# Patient Record
Sex: Female | Born: 1950 | Race: Black or African American | Hispanic: No | Smoking: Current every day smoker
Health system: Southern US, Community
[De-identification: ages and names within clinical notes are randomized; demographics above are authoritative.]

## PROBLEM LIST (undated history)

## (undated) DIAGNOSIS — G8929 Other chronic pain: Secondary | ICD-10-CM

## (undated) DIAGNOSIS — I10 Essential (primary) hypertension: Secondary | ICD-10-CM

## (undated) HISTORY — DX: Other chronic pain: G89.29

## (undated) HISTORY — PX: ABDOMINAL HYSTERECTOMY: SHX81

## (undated) HISTORY — PX: OTHER SURGICAL HISTORY: SHX169

## (undated) HISTORY — PX: CHOLECYSTECTOMY: SHX55

---

## 2007-12-02 ENCOUNTER — Emergency Department (HOSPITAL_COMMUNITY): Admission: EM | Admit: 2007-12-02 | Discharge: 2007-12-02 | Payer: Self-pay | Admitting: Emergency Medicine

## 2008-06-19 ENCOUNTER — Emergency Department (HOSPITAL_COMMUNITY): Admission: EM | Admit: 2008-06-19 | Discharge: 2008-06-19 | Payer: Self-pay | Admitting: Family Medicine

## 2008-10-22 ENCOUNTER — Encounter: Admission: RE | Admit: 2008-10-22 | Discharge: 2008-10-22 | Payer: Self-pay | Admitting: Family Medicine

## 2009-10-31 ENCOUNTER — Ambulatory Visit (HOSPITAL_COMMUNITY): Admission: RE | Admit: 2009-10-31 | Discharge: 2009-10-31 | Payer: Self-pay | Admitting: General Surgery

## 2009-11-18 ENCOUNTER — Ambulatory Visit (HOSPITAL_COMMUNITY): Admission: RE | Admit: 2009-11-18 | Discharge: 2009-11-18 | Payer: Self-pay | Admitting: General Surgery

## 2010-04-05 ENCOUNTER — Encounter: Payer: Self-pay | Admitting: General Surgery

## 2010-05-29 LAB — BASIC METABOLIC PANEL
BUN: 10 mg/dL (ref 6–23)
CO2: 27 mEq/L (ref 19–32)
Chloride: 106 mEq/L (ref 96–112)
Creatinine, Ser: 0.81 mg/dL (ref 0.4–1.2)
GFR calc non Af Amer: >60 mL/min (ref >60)
Sodium: 141 mEq/L (ref 135–145)

## 2010-05-29 LAB — SURGICAL PCR SCREEN: MRSA, PCR: NEGATIVE

## 2014-03-21 ENCOUNTER — Emergency Department (HOSPITAL_COMMUNITY)
Admission: EM | Admit: 2014-03-21 | Discharge: 2014-03-21 | Disposition: A | Discharge status: Home / Self Care | Payer: Medicare HMO | Attending: Emergency Medicine | Admitting: Emergency Medicine

## 2014-03-21 ENCOUNTER — Encounter (HOSPITAL_COMMUNITY): Payer: Self-pay | Admitting: Emergency Medicine

## 2014-03-21 ENCOUNTER — Emergency Department (HOSPITAL_COMMUNITY): Payer: Medicare HMO

## 2014-03-21 DIAGNOSIS — R079 Chest pain, unspecified: Secondary | ICD-10-CM | POA: Insufficient documentation

## 2014-03-21 DIAGNOSIS — R61 Generalized hyperhidrosis: Secondary | ICD-10-CM | POA: Diagnosis not present

## 2014-03-21 DIAGNOSIS — R0602 Shortness of breath: Secondary | ICD-10-CM | POA: Insufficient documentation

## 2014-03-21 DIAGNOSIS — M25551 Pain in right hip: Secondary | ICD-10-CM | POA: Diagnosis not present

## 2014-03-21 DIAGNOSIS — Z720 Tobacco use: Secondary | ICD-10-CM | POA: Diagnosis not present

## 2014-03-21 DIAGNOSIS — M549 Dorsalgia, unspecified: Secondary | ICD-10-CM | POA: Diagnosis not present

## 2014-03-21 HISTORY — DX: Essential (primary) hypertension: I10

## 2014-03-21 LAB — CBC WITH DIFFERENTIAL/PLATELET
BASOS PCT: 1 % (ref 0–1)
Basophils Absolute: 0 10*3/uL (ref 0.0–0.1)
EOS PCT: 4 % (ref 0–5)
Eosinophils Absolute: 0.2 10*3/uL (ref 0.0–0.7)
HEMATOCRIT: 35.4 % — AB (ref 36.0–46.0)
HEMOGLOBIN: 11.2 g/dL — AB (ref 12.0–15.0)
LYMPHS ABS: 2.4 10*3/uL (ref 0.7–4.0)
LYMPHS PCT: 41 % (ref 12–46)
MCH: 28.6 pg (ref 26.0–34.0)
MCHC: 31.6 g/dL (ref 30.0–36.0)
MCV: 90.3 fL (ref 78.0–100.0)
MONOS PCT: 7 % (ref 3–12)
Monocytes Absolute: 0.4 10*3/uL (ref 0.1–1.0)
Neutro Abs: 2.7 10*3/uL (ref 1.7–7.7)
Neutrophils Relative %: 47 % (ref 43–77)
Platelets: 257 10*3/uL (ref 150–400)
RBC: 3.92 MIL/uL (ref 3.87–5.11)
RDW: 14.2 % (ref 11.5–15.5)
WBC: 5.7 10*3/uL (ref 4.0–10.5)

## 2014-03-21 LAB — BASIC METABOLIC PANEL
Anion gap: 7 (ref 5–15)
BUN: 15 mg/dL (ref 6–23)
CALCIUM: 8.8 mg/dL (ref 8.4–10.5)
CO2: 25 mmol/L (ref 19–32)
Chloride: 107 mEq/L (ref 96–112)
Creatinine, Ser: 0.91 mg/dL (ref 0.50–1.10)
GFR calc non Af Amer: 67 mL/min — ABNORMAL LOW (ref >90)
GFR, EST AFRICAN AMERICAN: 77 mL/min — AB (ref >90)
GLUCOSE: 110 mg/dL — AB (ref 70–99)
POTASSIUM: 3.7 mmol/L (ref 3.5–5.1)
SODIUM: 139 mmol/L (ref 135–145)

## 2014-03-21 LAB — D-DIMER, QUANTITATIVE (NOT AT ARMC)

## 2014-03-21 LAB — TROPONIN I

## 2014-03-21 MED ORDER — NAPROXEN 500 MG PO TABS: 500.0000 mg | ORAL_TABLET | Freq: Two times a day (BID) | ORAL | Status: AC

## 2014-03-21 MED ORDER — KETOROLAC TROMETHAMINE 30 MG/ML IJ SOLN
INTRAMUSCULAR | Status: AC
Start: 2014-03-21 — End: 2014-03-21
  Filled 2014-03-21: qty 1

## 2014-03-21 MED ORDER — KETOROLAC TROMETHAMINE 30 MG/ML IJ SOLN
30.0000 mg | Freq: Once | INTRAMUSCULAR | Status: AC
  Administered 2014-03-21: 30 mg via INTRAVENOUS

## 2014-03-21 NOTE — ED Notes (Signed)
Dr. glick at the bedside. 

## 2014-03-21 NOTE — Discharge Instructions (Signed)
Go for the tests which were ordered by your cardiologist.   Chest Pain (Nonspecific) It is often hard to give a specific diagnosis for the cause of chest pain. There is always a chance that your pain could be related to something serious, such as a heart attack or a blood clot in the lungs. You need to follow up with your health care provider for further evaluation. CAUSES   Heartburn.  Pneumonia or bronchitis.  Anxiety or stress.  Inflammation around your heart (pericarditis) or lung (pleuritis or pleurisy).  A blood clot in the lung.  A collapsed lung (pneumothorax). It can develop suddenly on its own (spontaneous pneumothorax) or from trauma to the chest.  Shingles infection (herpes zoster virus). The chest wall is composed of bones, muscles, and cartilage. Any of these can be the source of the pain.  The bones can be bruised by injury.  The muscles or cartilage can be strained by coughing or overwork.  The cartilage can be affected by inflammation and become sore (costochondritis). DIAGNOSIS  Lab tests or other studies may be needed to find the cause of your pain. Your health care provider may have you take a test called an ambulatory electrocardiogram (ECG). An ECG records your heartbeat patterns over a 24-hour period. You may also have other tests, such as:  Transthoracic echocardiogram (TTE). During echocardiography, sound waves are used to evaluate how blood flows through your heart.  Transesophageal echocardiogram (TEE).  Cardiac monitoring. This allows your health care provider to monitor your heart rate and rhythm in real time.  Holter monitor. This is a portable device that records your heartbeat and can help diagnose heart arrhythmias. It allows your health care provider to track your heart activity for several days, if needed.  Stress tests by exercise or by giving medicine that makes the heart beat faster. TREATMENT   Treatment depends on what may be causing  your chest pain. Treatment may include:  Acid blockers for heartburn.  Anti-inflammatory medicine.  Pain medicine for inflammatory conditions.  Antibiotics if an infection is present.  You may be advised to change lifestyle habits. This includes stopping smoking and avoiding alcohol, caffeine, and chocolate.  You may be advised to keep your head raised (elevated) when sleeping. This reduces the chance of acid going backward from your stomach into your esophagus. Most of the time, nonspecific chest pain will improve within 2-3 days with rest and mild pain medicine.  HOME CARE INSTRUCTIONS   If antibiotics were prescribed, take them as directed. Finish them even if you start to feel better.  For the next few days, avoid physical activities that bring on chest pain. Continue physical activities as directed.  Do not use any tobacco products, including cigarettes, chewing tobacco, or electronic cigarettes.  Avoid drinking alcohol.  Only take medicine as directed by your health care provider.  Follow your health care provider's suggestions for further testing if your chest pain does not go away.  Keep any follow-up appointments you made. If you do not go to an appointment, you could develop lasting (chronic) problems with pain. If there is any problem keeping an appointment, call to reschedule. SEEK MEDICAL CARE IF:   Your chest pain does not go away, even after treatment.  You have a rash with blisters on your chest.  You have a fever. SEEK IMMEDIATE MEDICAL CARE IF:   You have increased chest pain or pain that spreads to your arm, neck, jaw, back, or abdomen.  You have  shortness of breath.  You have an increasing cough, or you cough up blood.  You have severe back or abdominal pain.  You feel nauseous or vomit.  You have severe weakness.  You faint.  You have chills. This is an emergency. Do not wait to see if the pain will go away. Get medical help at once. Call your  local emergency services (911 in [place] Do not drive yourself to the hospital. MAKE SURE YOU:   Understand these instructions.  Will watch your condition.  Will get help right away if you are not doing well or get worse. Document Released: 12/09/2004 Document Revised: 03/06/2013 Document Reviewed: 10/05/2007 [facility] Patient Information 2015 [place], [place]. This information is not intended to replace advice given to you by your health care provider. Make sure you discuss any questions you have with your health care provider.  Naproxen and naproxen sodium oral immediate-release tablets What is this medicine? NAPROXEN (na PROX en) is a non-steroidal anti-inflammatory drug (NSAID). It is used to reduce swelling and to treat pain. This medicine may be used for dental pain, headache, or painful monthly periods. It is also used for painful joint and muscular problems such as arthritis, tendinitis, bursitis, and gout. This medicine may be used for other purposes; ask your health care provider or pharmacist if you have questions. COMMON BRAND NAME(S): Aflaxen, Aleve, Aleve Arthritis, All Day Relief, Anaprox, Anaprox DS, Naprosyn What should I tell my health care provider before I take this medicine? They need to know if you have any of these conditions: -asthma -cigarette smoker -drink more than 3 alcohol containing drinks a day -heart disease or circulation problems such as heart failure or leg edema (fluid retention) -high blood pressure -kidney disease -liver disease -stomach bleeding or ulcers -an unusual or allergic reaction to naproxen, aspirin, other NSAIDs, other medicines, foods, dyes, or preservatives -pregnant or trying to get pregnant -breast-feeding How should I use this medicine? Take this medicine by mouth with a glass of water. Follow the directions on the prescription label. Take it with food if your stomach gets upset. Try to not lie down for at least 10 minutes after you  take it. Take your medicine at regular intervals. Do not take your medicine more often than directed. Long-term, continuous use may increase the risk of heart attack or stroke. A special MedGuide will be given to you by the pharmacist with each prescription and refill. Be sure to read this information carefully each time. Talk to your pediatrician regarding the use of this medicine in children. Special care may be needed. Overdosage: If you think you have taken too much of this medicine contact a poison control center or emergency room at once. NOTE: This medicine is only for you. Do not share this medicine with others. What if I miss a dose? If you miss a dose, take it as soon as you can. If it is almost time for your next dose, take only that dose. Do not take double or extra doses. What may interact with this medicine? -alcohol -aspirin -cidofovir -diuretics -lithium -methotrexate -other drugs for inflammation like ketorolac or prednisone -pemetrexed -probenecid -warfarin This list may not describe all possible interactions. Give your health care provider a list of all the medicines, herbs, non-prescription drugs, or dietary supplements you use. Also tell them if you smoke, drink alcohol, or use illegal drugs. Some items may interact with your medicine. What should I watch for while using this medicine? Tell your doctor or health  care professional if your pain does not get better. Talk to your doctor before taking another medicine for pain. Do not treat yourself. This medicine does not prevent heart attack or stroke. In fact, this medicine may increase the chance of a heart attack or stroke. The chance may increase with longer use of this medicine and in people who have heart disease. If you take aspirin to prevent heart attack or stroke, talk with your doctor or health care professional. Do not take other medicines that contain aspirin, ibuprofen, or naproxen with this medicine. Side  effects such as stomach upset, nausea, or ulcers may be more likely to occur. Many medicines available without a prescription should not be taken with this medicine. This medicine can cause ulcers and bleeding in the stomach and intestines at any time during treatment. Do not smoke cigarettes or drink alcohol. These increase irritation to your stomach and can make it more susceptible to damage from this medicine. Ulcers and bleeding can happen without warning symptoms and can cause death. You may get drowsy or dizzy. Do not drive, use machinery, or do anything that needs mental alertness until you know how this medicine affects you. Do not stand or sit up quickly, especially if you are an older patient. This reduces the risk of dizzy or fainting spells. This medicine can cause you to bleed more easily. Try to avoid damage to your teeth and gums when you brush or floss your teeth. What side effects may I notice from receiving this medicine? Side effects that you should report to your doctor or health care professional as soon as possible: -black or bloody stools, blood in the urine or vomit -blurred vision -chest pain -difficulty breathing or wheezing -nausea or vomiting -severe stomach pain -skin rash, skin redness, blistering or peeling skin, hives, or itching -slurred speech or weakness on one side of the body -swelling of eyelids, throat, lips -unexplained weight gain or swelling -unusually weak or tired -yellowing of eyes or skin Side effects that usually do not require medical attention (report to your doctor or health care professional if they continue or are bothersome): -constipation -headache -heartburn This list may not describe all possible side effects. Call your doctor for medical advice about side effects. You may report side effects to FDA at 1-800-FDA-1088. Where should I keep my medicine? Keep out of the reach of children. Store at room temperature between 15 and 30 degrees  C (59 and 86 degrees F). Keep container tightly closed. Throw away any unused medicine after the expiration date. NOTE: This sheet is a summary. It may not cover all possible information. If you have questions about this medicine, talk to your doctor, pharmacist, or health care provider.  2015, [employer]. (2009-03-03 20:10:16)

## 2014-03-21 NOTE — ED Notes (Signed)
Called main lab in regards to d-dimer.

## 2014-03-21 NOTE — ED Provider Notes (Signed)
CSN: [id number]     Arrival date & time 03/21/14  0133 History  This chart was scribe for Dione Booze, MD by Angelene Giovanni, ED Scribe. The patient was seen in room A01C/A01C and the patient's care was started at 1:49 AM.     Chief Complaint  Patient presents with  . Chest Pain   The history is provided by the patient. No language interpreter was used.   HPI Comments: Makayla Bishop is a 64 y.o. female with a hx of HTN who presents to the Emergency Department complaining of a gradually worsening right hip pain that radiated down to her foot onset a couple of weeks and a sharp intermittent lower sternal area pain that radiates to her back onset 1 week ago. She explains that the pain lasts about 10 minutes and she cannot move or breathe during onset. She also reports associated SOB and diaphoresis. She denies N/V and cough. She states that she took 2 baby aspirin PTA. She reports that she was smoking 3 or 4 cigarettes a day before onset of symptoms. She states a family hx of diabetes and that her father died of CHF at the age of 39. She denies any pain currently.   PCP: Dr. Carmina Miller Cardiologist: [facility]   No past medical history on file. No past surgical history on file. No family history on file. History  Substance Use Topics  . Smoking status: Not on file  . Smokeless tobacco: Not on file  . Alcohol Use: Not on file   OB History    No data available     Review of Systems  Constitutional: Positive for diaphoresis.  Respiratory: Positive for shortness of breath. Negative for cough.   Cardiovascular: Positive for chest pain.  Gastrointestinal: Negative for nausea and vomiting.  Musculoskeletal: Positive for back pain.  All other systems reviewed and are negative.     Allergies  Review of patient's allergies indicates not on file.  Home Medications   Prior to Admission medications   Not on File   BP 129/79 mmHg  Pulse 79  Temp(Src) 98 F (36.7 C) (Oral)   Resp 18  Ht  (1.803 m)  Wt 289 lb (131.09 kg)  BMI 40.33 kg/m2  SpO2 99% Physical Exam  Constitutional: She is oriented to person, place, and time. She appears well-developed and well-nourished. No distress.  HENT:  Head: Normocephalic and atraumatic.  Eyes: Conjunctivae and EOM are normal. Pupils are equal, round, and reactive to light.  Neck: Normal range of motion. Neck supple. No JVD present.  Cardiovascular: Normal rate, regular rhythm and normal heart sounds.   No murmur heard. Pulmonary/Chest: Effort normal and breath sounds normal. She has no wheezes. She has no rales.  Abdominal: Soft. Bowel sounds are normal. She exhibits no distension and no mass. There is no tenderness.  Musculoskeletal: Normal range of motion. She exhibits no edema.  Dorsalis pedis pulses are weak bilaterally but strong posterior tibial pulses   Lymphadenopathy:    She has no cervical adenopathy.  Neurological: She is alert and oriented to person, place, and time. She has normal reflexes. No cranial nerve deficit. Coordination normal.  Skin: Skin is warm and dry. No rash noted.  Psychiatric: She has a normal mood and affect. Her behavior is normal. Judgment and thought content normal.  Nursing note and vitals reviewed.   ED Course  Procedures (including critical care time) DIAGNOSTIC STUDIES: Oxygen Saturation is 99% on RA, normal by my interpretation.  COORDINATION OF CARE: 2:00 AM- Pt advised of plan for treatment and pt agrees.    Labs Review Results for orders placed or performed during the hospital encounter of 03/21/14  Basic metabolic panel  Result Value Ref Range   Sodium 139 135 - 145 mmol/L   Potassium 3.7 3.5 - 5.1 mmol/L   Chloride 107 96 - 112 mEq/L   CO2 25 19 - 32 mmol/L   Glucose, Bld 110 (H) 70 - 99 mg/dL   BUN 15 6 - 23 mg/dL   Creatinine, Ser 1.61 0.50 - 1.10 mg/dL   Calcium 8.8 8.4 - 09.6 mg/dL   GFR calc non Af Amer 67 (L) >90 mL/min   GFR calc Af Amer 77 (L)  >90 mL/min   Anion gap 7 5 - 15  Troponin I  Result Value Ref Range   Troponin I <0.03 <0.031 ng/mL  CBC with Differential  Result Value Ref Range   WBC 5.7 4.0 - 10.5 K/uL   RBC 3.92 3.87 - 5.11 MIL/uL   Hemoglobin 11.2 (L) 12.0 - 15.0 g/dL   HCT 04.5 (L) 40.9 - 81.1 %   MCV 90.3 78.0 - 100.0 fL   MCH 28.6 26.0 - 34.0 pg   MCHC 31.6 30.0 - 36.0 g/dL   RDW 91.4 78.2 - 95.6 %   Platelets 257 150 - 400 K/uL   Neutrophils Relative % 47 43 - 77 %   Neutro Abs 2.7 1.7 - 7.7 K/uL   Lymphocytes Relative 41 12 - 46 %   Lymphs Abs 2.4 0.7 - 4.0 K/uL   Monocytes Relative 7 3 - 12 %   Monocytes Absolute 0.4 0.1 - 1.0 K/uL   Eosinophils Relative 4 0 - 5 %   Eosinophils Absolute 0.2 0.0 - 0.7 K/uL   Basophils Relative 1 0 - 1 %   Basophils Absolute 0.0 0.0 - 0.1 K/uL  D-dimer, quantitative  Result Value Ref Range   D-Dimer, Quant <0.27 0.00 - 0.48 ug/mL-FEU    Imaging Review Dg Chest 2 View  03/21/2014   CLINICAL DATA:  Acute onset of centralized chest pain and weakness. Initial encounter.  EXAM: CHEST  2 VIEW  COMPARISON:  Chest radiograph performed 08/29/2009  FINDINGS: The lungs are well-aerated. Chronically increased interstitial markings are seen. There is no evidence of focal opacification, pleural effusion or pneumothorax.  The heart is borderline normal in size; the mediastinal contour is within normal limits. No acute osseous abnormalities are seen. Clips are noted within the right upper quadrant, reflecting prior cholecystectomy.  IMPRESSION: Mild chronically increased interstitial markings noted. Lungs otherwise clear.   Electronically Signed   By: Roanna Raider M.D.   On: 03/21/2014 02:15     EKG Interpretation   Date/Time:  Thursday March 21 2014 01:39:15 EST Ventricular Rate:  79 PR Interval:  180 QRS Duration: 85 QT Interval:  390 QTC Calculation: 447 R Axis:   35 Text Interpretation:  Sinus rhythm Baseline wander in lead(s) II III aVF  When compared with ECG of  06/29/2009, Premature ventricular complexes are  no longer Present Confirmed by [facility]  MD, Tria Noguera ([id number]) on 03/21/2014  1:46:40 AM      MDM   Final diagnoses:  Chest pain, unspecified chest pain type    Chest pain of uncertain cause. Pattern is not suspicious for coronary disease. She is alert he been seen by cardiologist who is started an outpatient workup. In the ED, ECG shows no acute changes. She did develop  an episode of pain while in the ED was given a dose of ketorolac with good relief of pain. Workup was negative including normal chest x-ray, d-dimer, troponin. She will be discharged with prescription for naproxen and continue outpatient workup that is sort of been started by her cardiologist.   I personally performed the services described in this documentation, which was scribed in my presence. The recorded information has been reviewed and is accurate.     Dione Boozeavid Rebel Willcutt, MD 03/21/14 [phone]

## 2014-03-21 NOTE — ED Notes (Signed)
Patient reports she had chest pain earlier tonight, but now rates pain 0/10. She experienced pain in center of chest that radiated to back. Saw primary doctor, who referred her to a cardiologist.  Alert and oriented at this time.

## 2014-03-21 NOTE — ED Notes (Signed)
Reported to Dr. Preston FleetingGlick that patient is now complaining of chest pain. MD allows 30mg  of toradol.

## 2014-03-21 NOTE — ED Notes (Signed)
Preparing for discharge.

## 2014-04-09 ENCOUNTER — Ambulatory Visit (INDEPENDENT_AMBULATORY_CARE_PROVIDER_SITE_OTHER): Payer: 59 | Admitting: Neurology

## 2014-04-09 ENCOUNTER — Encounter: Payer: Self-pay | Admitting: Neurology

## 2014-04-09 VITALS — BP 113/77 | HR 85 | Temp 99.1°F | Resp 15 | Ht 68.0 in | Wt 290.0 lb

## 2014-04-09 DIAGNOSIS — G8929 Other chronic pain: Secondary | ICD-10-CM

## 2014-04-09 DIAGNOSIS — R351 Nocturia: Secondary | ICD-10-CM

## 2014-04-09 DIAGNOSIS — G4733 Obstructive sleep apnea (adult) (pediatric): Secondary | ICD-10-CM

## 2014-04-09 NOTE — Progress Notes (Signed)
Subjective:    Patient ID: Makayla Bishop is a 64 y.o. female.  HPI     Huston Foley, MD, PhD [facility] [address], [address] [address] [place], [place] [address]  Dear Vonna Kotyk,   I saw your patient, Makayla Bishop, upon your kind request in my neurologic clinic today for initial consultation of her sleep disorder, in particular, concern for underlying obstructive sleep apnea. The patient is unaccompanied today. As you know, Makayla Bishop is a 64 year old right-handed woman with an underlying medical history of smoking, hypertension, chronic pain (followed by a clinic in [facility], on Norco qid), severe obesity, and recent onset chest pain, who reports snoring and witnessed apneas, waking up with a gasp. She drinks caffeine throughout the day in the form of teas and also coffee. She is trying lose weight and quit smoking.   Her bedtime is around 11 PM and she usually watches TV in bed. She has a clean size bed. She usually sleeps alone. She has a significant other. She has been told by her daughter who lives with her and her significant other that she quits breathing in his sleep and she makes abnormal breathing sounds as well including snoring sounds and strangling sounds. Wake time is around 5 AM or so. She does not wake up rested. She wakes up at least once per night to go to the bathroom. She denies restless leg symptoms and is not known to twitch or kick in her sleep. She has an Epworth sleepiness score of 4 out of 24 today. She has not fallen asleep driving. She does not typically wake up with a headache. Her brother has obstructive sleep apnea and has a CPAP machine. She takes amitriptyline at night around 9 PM. She also takes Norco 4 times a day for chronic pain. She drinks alcohol occasionally, maybe once or twice per month and she smokes about 5 cigarettes per day at this time.  She does not typically take a nap. She moved from [place] to be closer to her son,  who has been diagnosed with multiple sclerosis. Her daughter and granddaughter live with her.  She denies cataplexy, sleep paralysis, hypnagogic or hypnopompic hallucinations, or sleep attacks. She does not report any vivid dreams, nightmares, dream enactments, or parasomnias, such as sleep talking or sleep walking. The patient has not had a sleep study or a home sleep test.   Her Past Medical History Is Significant For: Past Medical History  Diagnosis Date  . Hypertension   . Chronic pain     shoulders, neck, left knee, right side    Her Past Surgical History Is Significant For: Past Surgical History  Procedure Laterality Date  . Other surgical history      shoulder surgery on right and left shoulders.   . Abdominal hysterectomy      total hysterectomy  . Other surgical history      meniscus left knee  . Cholecystectomy      Her Family History Is Significant For: Family History  Problem Relation Age of Onset  . Heart failure Father   . Diabetes Sister   . Heart attack Sister     deceased at age 68  . Diabetes Brother     3 brothers with disease    Her Social History Is Significant For: History   Social History  . Marital Status: Divorced    Spouse Name: N/A    Number of Children: 3  . Years of Education:  14   Occupational History  .      retired   Social History Main Topics  . Smoking status: Current Every Day Smoker -- 0.50 packs/day    Types: Cigarettes  . Smokeless tobacco: Never Used  . Alcohol Use: 0.0 oz/week    0 Not specified per week     Comment: occas.  . Drug Use: No  . Sexual Activity: None   Other Topics Concern  . None   Social History Narrative    Her Allergies Are:  Allergies  Allergen Reactions  . Bee Venom     carries a epipen  . Mango Flavor     Rash   :   Her Current Medications Are:  Outpatient Encounter Prescriptions as of 04/09/2014  Medication Sig  . amitriptyline (ELAVIL) 50 MG tablet Take 50 mg by mouth at bedtime.   Marland Kitchen amLODipine (NORVASC) 10 MG tablet Take 10 mg by mouth daily.  . baclofen (LIORESAL) 10 MG tablet   . HYDROcodone-acetaminophen (NORCO) 10-325 MG per tablet Take 1 tablet by mouth every 6 (six) hours as needed for moderate pain. for pain  . ibuprofen (ADVIL,MOTRIN) 600 MG tablet Take 600 mg by mouth every 8 (eight) hours as needed for mild pain.   Marland Kitchen lisinopril-hydrochlorothiazide (PRINZIDE,ZESTORETIC) 20-12.5 MG per tablet Take 1 tablet by mouth daily.  . naproxen (NAPROSYN) 500 MG tablet Take 1 tablet (500 mg total) by mouth 2 (two) times daily.  Marland Kitchen UNABLE TO FIND Epipen, as needed  :  Review of Systems:  Out of a complete 14 point review of systems, all are reviewed and negative with the exception of these symptoms as listed below:   Review of Systems  Constitutional:       Weight loss  Cardiovascular: Positive for chest pain.  Neurological:       Snoring,pain on right side when lying on it    Objective:  Neurologic Exam  Physical Exam Physical Examination:   Filed Vitals:   04/09/14 0855  BP: 113/77  Pulse: 85  Temp: 99.1 F (37.3 C)  Resp: 15    General Examination: The patient is a very pleasant 64 y.o. female in no acute distress. She appears well-developed and well-nourished and adequately groomed.   HEENT: Normocephalic, atraumatic, pupils are equal, round and reactive to light and accommodation. Funduscopic exam is normal with sharp disc margins noted. Extraocular tracking is good without limitation to gaze excursion or nystagmus noted. Normal smooth pursuit is noted. Hearing is grossly intact. Tympanic membranes are clear bilaterally. Face is symmetric with normal facial animation and normal facial sensation. Speech is clear with no dysarthria noted. There is no hypophonia. There is no lip, neck/head, jaw or voice tremor. Neck is supple with full range of passive and active motion. There are no carotid bruits on auscultation. Oropharynx exam reveals: moderate mouth  dryness, adequate dental hygiene. She has full dentures on the top and very few teeth on the lower. She has severe airway crowding secondary to large tongue, redundant and thick soft palate, tonsils of 2+ bilaterally. Neck size is 16 inches. She has a minimal overbite. Nasal inspection reveals no significant nasal mucosal bogginess or redness and no septal deviation.   Chest: Clear to auscultation without wheezing, rhonchi or crackles noted.  Heart: S1+S2+0, regular and normal without murmurs, rubs or gallops noted.   Abdomen: Soft, non-tender and non-distended with normal bowel sounds appreciated on auscultation.  Extremities: There is no pitting edema in the distal lower extremities  bilaterally. Pedal pulses are intact.  Skin: Warm and dry without trophic changes noted. There are no varicose veins.  Musculoskeletal: exam reveals no obvious joint deformities, tenderness or joint swelling or erythema, but she reports pain in her right arm, right hip, and right knee.   Neurologically:  Mental status: The patient is awake, alert and oriented in all 4 spheres. Her immediate and remote memory, attention, language skills and fund of knowledge are appropriate. There is no evidence of aphasia, agnosia, apraxia or anomia. Speech is clear with normal prosody and enunciation. Thought process is linear. Mood is normal and affect is normal.  Cranial nerves II - XII are as described above under HEENT exam. In addition: shoulder shrug is normal with equal shoulder height noted. Motor exam: Normal bulk, strength and tone is noted. There is no drift, tremor or rebound. Romberg is negative. Reflexes are 2+ throughout. Babinski: Toes are flexor bilaterally. Fine motor skills and coordination: intact with normal finger taps, normal hand movements, normal rapid alternating patting, normal foot taps and normal foot agility.  Cerebellar testing: No dysmetria or intention tremor on finger to nose testing. Heel to shin  is unremarkable bilaterally. There is no truncal or gait ataxia.  Sensory exam: intact to light touch, pinprick, vibration, temperature sense in the upper and lower extremities.  Gait, station and balance: She stands with mild difficulty. No veering to one side is noted. No leaning to one side is noted. Posture is age-appropriate and stance is narrow based. Gait shows normal stride length and normal pace. No problems turning are noted. She turns en bloc. Tandem walk is difficult for her.   Assessment and Plan:  In summary, Makayla Bishop is a very pleasant 64 y.o.-year old female with an underlying medical history of smoking, hypertension, chronic pain (followed by a clinic in [facility], on Norco qid), severe obesity, and recent onset chest pain, who reports snoring and witnessed apneas, waking up with a gasp, and nocturia. Her history and physical exam are in keeping with obstructive sleep apnea (OSA). I had a long chat with the patient about my findings and the diagnosis of OSA, its prognosis and treatment options. We talked about medical treatments, surgical interventions and non-pharmacological approaches. I explained in particular the risks and ramifications of untreated moderate to severe OSA, especially with respect to developing cardiovascular disease down the Road, including congestive heart failure, difficult to treat hypertension, cardiac arrhythmias, or stroke. Even type 2 diabetes has, in part, been linked to untreated OSA. Symptoms of untreated OSA include daytime sleepiness, memory problems, mood irritability and mood disorder such as depression and anxiety, lack of energy, as well as recurrent headaches, especially morning headaches. We talked about smoking cessation and trying to maintain a healthy lifestyle in general, as well as the importance of weight control. I encouraged the patient to eat healthy, exercise daily and keep well hydrated, to keep a scheduled bedtime and wake time  routine, to not skip any meals and eat healthy snacks in between meals. I advised the patient not to drive when feeling sleepy. I have asked her to reduce her caffeine intake and increase her water intake.  I recommended the following at this time: sleep study with potential positive airway pressure titration. (We will score hypopneas at 3% and split the sleep study into diagnostic and treatment portion, if the estimated. 2 hour AHI is >15/h, unless mandated otherwise by her insurance carrier).   I explained the sleep test procedure to the  patient and also outlined possible surgical and non-surgical treatment options of OSA, including the use of a custom-made dental device (which would require a referral to a specialist dentist or oral surgeon), upper airway surgical options, such as pillar implants, radiofrequency surgery, tongue base surgery, and UPPP (which would involve a referral to an ENT surgeon). Rarely, jaw surgery such as mandibular advancement may be considered.  I also explained the CPAP treatment option to the patient, who indicated that she would be willing to try CPAP if the need arises. I explained the importance of being compliant with PAP treatment, not only for insurance purposes but primarily to improve Her symptoms, and for the patient's long term health benefit, including to reduce Her cardiovascular risks. I answered all her questions today and the patient was in agreement. I would like to see her back after the sleep study is completed and encouraged her to call with any interim questions, concerns, problems or updates.   Thank you very much for allowing me to participate in the care of this nice patient. If I can be of any further assistance to you please do not hesitate to call me at [phone].  Sincerely,   Huston Foley, MD, PhD

## 2014-04-09 NOTE — Patient Instructions (Addendum)
Based on your symptoms and your exam I believe you are at risk for obstructive sleep apnea or OSA, and I think we should proceed with a sleep study to determine whether you do or do not have OSA and how severe it is. If you have more than mild OSA, I want you to consider treatment with CPAP. Please remember, the risks and ramifications of moderate to severe obstructive sleep apnea or OSA are: Cardiovascular disease, including congestive heart failure, stroke, difficult to control hypertension, arrhythmias, and even type 2 diabetes has been linked to untreated OSA. Sleep apnea causes disruption of sleep and sleep deprivation in most cases, which, in turn, can cause recurrent headaches, problems with memory, mood, concentration, focus, and vigilance. Most people with untreated sleep apnea report excessive daytime sleepiness, which can affect their ability to drive. Please do not drive if you feel sleepy.  I will see you back after your sleep study to go over the test results and where to go from there. We will call you after your sleep study and to set up an appointment at the time.   Please remember to try to maintain good sleep hygiene, which means: Keep a regular sleep and wake schedule, try not to exercise or have a meal within 2 hours of your bedtime, try to keep your bedroom conducive for sleep, that is, cool and dark, without light distractors such as an illuminated alarm clock, and refrain from watching TV right before sleep or in the middle of the night and do not keep the TV or radio on during the night. Also, try not to use or play on electronic devices at bedtime, such as your cell phone, tablet PC or laptop. If you like to read at bedtime on an electronic device, try to dim the background light as much as possible. Do not eat in the middle of the night.   Reduce your caffeine intake and increase your water intake.

## 2014-04-18 ENCOUNTER — Encounter: Payer: Self-pay | Admitting: Neurology

## 2014-04-29 ENCOUNTER — Telehealth: Payer: Self-pay | Admitting: Neurology

## 2014-04-29 NOTE — Telephone Encounter (Signed)
Patient just called me back, will cone tonight between 10 and 10.30 PM. CD

## 2014-04-29 NOTE — Telephone Encounter (Signed)
Left VM instructions to arrive 60 minutes later than originally told, at 10.30 PM - confirmed the day of sleep study, address and number for our phone answering service. CD

## 2014-05-23 ENCOUNTER — Ambulatory Visit (INDEPENDENT_AMBULATORY_CARE_PROVIDER_SITE_OTHER): Payer: 59 | Admitting: Neurology

## 2014-05-23 VITALS — BP 114/76 | HR 76 | Ht 68.0 in | Wt 290.0 lb

## 2014-05-23 DIAGNOSIS — G4733 Obstructive sleep apnea (adult) (pediatric): Secondary | ICD-10-CM

## 2014-05-23 DIAGNOSIS — G4761 Periodic limb movement disorder: Secondary | ICD-10-CM

## 2014-05-23 DIAGNOSIS — G479 Sleep disorder, unspecified: Secondary | ICD-10-CM

## 2014-05-24 NOTE — Sleep Study (Signed)
See attached document in Encounters tab 

## 2014-06-05 ENCOUNTER — Telehealth: Payer: Self-pay | Admitting: Neurology

## 2014-06-05 DIAGNOSIS — G4733 Obstructive sleep apnea (adult) (pediatric): Secondary | ICD-10-CM

## 2014-06-05 NOTE — Telephone Encounter (Signed)
Please call and notify patient that the recent sleep study confirmed the diagnosis of OSA. She did well with CPAP during the study with significant improvement of the respiratory events. Therefore, I would like start the patient on CPAP at home. I placed the order in the chart.   Arrange for CPAP set up at home through a DME company of patient's choice and fax/route report to PCP and referring MD (if other than PCP).   The patient will also need a follow up appointment with me in 6-8 weeks post set up that has to be scheduled; help the patient schedule this (in a follow-up slot).   Please re-enforce the importance of compliance with treatment and the need for us to monitor compliance data.   Once you have spoken to the patient and scheduled the return appointment, you may close this encounter, thanks,   Kaliana Albino, MD, PhD [facility] ([facility]    

## 2014-06-08 ENCOUNTER — Encounter: Payer: Self-pay | Admitting: Neurology

## 2014-06-10 ENCOUNTER — Encounter: Payer: Self-pay | Admitting: *Deleted

## 2014-06-10 NOTE — Telephone Encounter (Signed)
The patient was contacted and provided the results of her split night sleep study which did confirm a diagnosis of OSA.  It was explained to the patient that CPAP therapy was recommended for treatment.  Given the patient has [employer] as her insurance she was referred to [facility] for CPAP set up.  The patient gave verbal permission to mail a copy of her test results.   Patient instructed to contact our office 6-8 weeks post set up to schedule a follow up appointment.

## 2015-05-08 ENCOUNTER — Other Ambulatory Visit (HOSPITAL_COMMUNITY): Payer: Self-pay | Admitting: Respiratory Therapy

## 2015-05-08 ENCOUNTER — Ambulatory Visit (HOSPITAL_COMMUNITY)
Admission: RE | Admit: 2015-05-08 | Discharge: 2015-05-08 | Disposition: A | Discharge status: Home / Self Care | Payer: Medicare HMO | Source: Ambulatory Visit | Attending: Specialist | Admitting: Specialist

## 2015-05-08 DIAGNOSIS — Z7729 Contact with and (suspected ) exposure to other hazardous substances: Secondary | ICD-10-CM

## 2015-05-08 DIAGNOSIS — Z029 Encounter for administrative examinations, unspecified: Secondary | ICD-10-CM | POA: Diagnosis not present

## 2015-05-08 LAB — CARBOXYHEMOGLOBIN
Carboxyhemoglobin: 1.9 % — ABNORMAL HIGH (ref 0.5–1.5)
Methemoglobin: 0.9 % (ref 0.0–1.5)
O2 SAT: 92.2 %
TOTAL HEMOGLOBIN: 12.8 g/dL (ref 12.0–16.0)

## 2015-08-07 ENCOUNTER — Encounter (HOSPITAL_COMMUNITY): Payer: Self-pay | Admitting: Emergency Medicine

## 2015-08-07 ENCOUNTER — Emergency Department (HOSPITAL_COMMUNITY)
Admission: EM | Admit: 2015-08-07 | Discharge: 2015-08-07 | Disposition: A | Discharge status: Home / Self Care | Payer: Medicare HMO | Attending: Emergency Medicine | Admitting: Emergency Medicine

## 2015-08-07 DIAGNOSIS — G8929 Other chronic pain: Secondary | ICD-10-CM | POA: Diagnosis not present

## 2015-08-07 DIAGNOSIS — Z791 Long term (current) use of non-steroidal anti-inflammatories (NSAID): Secondary | ICD-10-CM | POA: Diagnosis not present

## 2015-08-07 DIAGNOSIS — Z77098 Contact with and (suspected) exposure to other hazardous, chiefly nonmedicinal, chemicals: Secondary | ICD-10-CM | POA: Diagnosis not present

## 2015-08-07 DIAGNOSIS — F17210 Nicotine dependence, cigarettes, uncomplicated: Secondary | ICD-10-CM | POA: Insufficient documentation

## 2015-08-07 DIAGNOSIS — F419 Anxiety disorder, unspecified: Secondary | ICD-10-CM | POA: Insufficient documentation

## 2015-08-07 DIAGNOSIS — Z79899 Other long term (current) drug therapy: Secondary | ICD-10-CM | POA: Diagnosis not present

## 2015-08-07 DIAGNOSIS — I10 Essential (primary) hypertension: Secondary | ICD-10-CM | POA: Insufficient documentation

## 2015-08-07 LAB — I-STAT ARTERIAL BLOOD GAS, ED
Acid-base deficit: 2 mmol/L (ref 0.0–2.0)
BICARBONATE: 22.4 meq/L (ref 20.0–24.0)
O2 Saturation: 94 %
TCO2: 23 mmol/L (ref 0–100)
pCO2 arterial: 35.3 mmHg (ref 35.0–45.0)
pH, Arterial: 7.409 (ref 7.350–7.450)
pO2, Arterial: 69 mmHg — ABNORMAL LOW (ref 80.0–100.0)

## 2015-08-07 LAB — COMPREHENSIVE METABOLIC PANEL
ALBUMIN: 3.8 g/dL (ref 3.5–5.0)
ALK PHOS: 71 U/L (ref 38–126)
ALT: 20 U/L (ref 14–54)
AST: 18 U/L (ref 15–41)
Anion gap: 8 (ref 5–15)
BILIRUBIN TOTAL: 0.4 mg/dL (ref 0.3–1.2)
BUN: 14 mg/dL (ref 6–20)
CALCIUM: 9.1 mg/dL (ref 8.9–10.3)
CO2: 21 mmol/L — ABNORMAL LOW (ref 22–32)
CREATININE: 0.72 mg/dL (ref 0.44–1.00)
Chloride: 109 mmol/L (ref 101–111)
GFR calc Af Amer: >60 mL/min (ref >60)
GFR calc non Af Amer: >60 mL/min (ref >60)
GLUCOSE: 89 mg/dL (ref 65–99)
Potassium: 3.4 mmol/L — ABNORMAL LOW (ref 3.5–5.1)
Sodium: 138 mmol/L (ref 135–145)
TOTAL PROTEIN: 6.7 g/dL (ref 6.5–8.1)

## 2015-08-07 LAB — URINALYSIS, ROUTINE W REFLEX MICROSCOPIC
Bilirubin Urine: NEGATIVE
GLUCOSE, UA: NEGATIVE mg/dL
Hgb urine dipstick: NEGATIVE
Ketones, ur: NEGATIVE mg/dL
Nitrite: NEGATIVE
PH: 5.5 (ref 5.0–8.0)
Protein, ur: NEGATIVE mg/dL
SPECIFIC GRAVITY, URINE: 1.035 — AB (ref 1.005–1.030)

## 2015-08-07 LAB — CBC WITH DIFFERENTIAL/PLATELET
BASOS ABS: 0 10*3/uL (ref 0.0–0.1)
BASOS PCT: 0 %
EOS ABS: 0.2 10*3/uL (ref 0.0–0.7)
EOS PCT: 3 %
HCT: 35.7 % — ABNORMAL LOW (ref 36.0–46.0)
Hemoglobin: 11.3 g/dL — ABNORMAL LOW (ref 12.0–15.0)
LYMPHS PCT: 35 %
Lymphs Abs: 2.3 10*3/uL (ref 0.7–4.0)
MCH: 29.1 pg (ref 26.0–34.0)
MCHC: 31.7 g/dL (ref 30.0–36.0)
MCV: 92 fL (ref 78.0–100.0)
MONO ABS: 0.3 10*3/uL (ref 0.1–1.0)
Monocytes Relative: 5 %
Neutro Abs: 3.7 10*3/uL (ref 1.7–7.7)
Neutrophils Relative %: 57 %
PLATELETS: 292 10*3/uL (ref 150–400)
RBC: 3.88 MIL/uL (ref 3.87–5.11)
RDW: 13.7 % (ref 11.5–15.5)
WBC: 6.5 10*3/uL (ref 4.0–10.5)

## 2015-08-07 LAB — CARBOXYHEMOGLOBIN
CARBOXYHEMOGLOBIN: 2.6 % — AB (ref 0.5–1.5)
Methemoglobin: 0.6 % (ref 0.0–1.5)
O2 Saturation: 91.6 %
Total hemoglobin: 11.5 g/dL — ABNORMAL LOW (ref 12.0–16.0)

## 2015-08-07 LAB — URINE MICROSCOPIC-ADD ON: RBC / HPF: NONE SEEN RBC/hpf (ref 0–5)

## 2015-08-07 NOTE — ED Provider Notes (Signed)
CSN: [id number]     Arrival date & time 08/07/15  1802 History   First MD Initiated Contact with Patient 08/07/15 1940     Chief Complaint  Patient presents with  . Toxic Inhalation     (Consider location/radiation/quality/duration/timing/severity/associated sxs/prior Treatment) HPI   Makayla Bishop is a 65 y.o. female who presents for evaluation of exposure to toxic gases. She reports that her septic sore types are leaking gases through the ground, and into her house where she lives. She believes she is being poisoned by hydrogen sulfide, and carbon monoxide. This has made her short of breath, have headache, neck pain and chest pain. She also feels like her eyes are burning. She states that she had a similar problem in February 2017, and at that time developed achiness, trouble breathing, and ultimately got a sinus infection from a very similar problem. Apparently some of the piping was worked on, but others couldn't be fixed. She states that she is unable to live in her house at this time because of the gases. He is a cigarette smoker. She denies vomiting, paresthesia, or dizziness. She takes chronic pain medication. There are no other known modifying factors.   Past Medical History  Diagnosis Date  . Hypertension   . Chronic pain     shoulders, neck, left knee, right side   Past Surgical History  Procedure Laterality Date  . Other surgical history      shoulder surgery on right and left shoulders.   . Abdominal hysterectomy      total hysterectomy  . Other surgical history      meniscus left knee  . Cholecystectomy     Family History  Problem Relation Age of Onset  . Heart failure Father   . Diabetes Sister   . Heart attack Sister     deceased at age 63  . Diabetes Brother     3 brothers with disease   Social History  Substance Use Topics  . Smoking status: Current Every Day Smoker -- 0.50 packs/day    Types: Cigarettes  . Smokeless tobacco: Never Used  . Alcohol  Use: 0.0 oz/week    0 Standard drinks or equivalent per week     Comment: occas.   OB History    No data available     Review of Systems  All other systems reviewed and are negative.     Allergies  Bee venom and Mango flavor  Home Medications   Prior to Admission medications   Medication Sig Start Date End Date Taking? Authorizing Provider  amitriptyline (ELAVIL) 50 MG tablet Take 50 mg by mouth at bedtime. 01/03/14   Historical Provider, MD  amLODipine (NORVASC) 10 MG tablet Take 10 mg by mouth daily.    Historical Provider, MD  baclofen (LIORESAL) 10 MG tablet  01/03/14   Historical Provider, MD  HYDROcodone-acetaminophen (NORCO) 10-325 MG per tablet Take 1 tablet by mouth every 6 (six) hours as needed for moderate pain. for pain 12/25/13   Historical Provider, MD  ibuprofen (ADVIL,MOTRIN) 600 MG tablet Take 600 mg by mouth every 8 (eight) hours as needed for mild pain.  03/19/14   Historical Provider, MD  lisinopril-hydrochlorothiazide (PRINZIDE,ZESTORETIC) 20-12.5 MG per tablet Take 1 tablet by mouth daily. 03/19/14   Historical Provider, MD  naproxen (NAPROSYN) 500 MG tablet Take 1 tablet (500 mg total) by mouth 2 (two) times daily. 03/21/14   Dione Boozeavid Glick, MD  UNABLE TO FIND Epipen, as needed  Historical Provider, MD   BP 116/74 mmHg  Pulse 73  Temp(Src) 98.7 F (37.1 C) (Oral)  Resp 20  SpO2 100% Physical Exam  Constitutional: She is oriented to person, place, and time. She appears well-developed and well-nourished.  HENT:  Head: Normocephalic and atraumatic.  Right Ear: External ear normal.  Left Ear: External ear normal.  Eyes: Conjunctivae and EOM are normal. Pupils are equal, round, and reactive to light.  Neck: Normal range of motion and phonation normal. Neck supple.  Cardiovascular: Normal rate, regular rhythm and normal heart sounds.   Pulmonary/Chest: Effort normal and breath sounds normal. She exhibits no bony tenderness.  Abdominal: Soft. There is no  tenderness.  Musculoskeletal: Normal range of motion.  Neurological: She is alert and oriented to person, place, and time. No cranial nerve deficit or sensory deficit. She exhibits normal muscle tone. Coordination normal.  Skin: Skin is warm, dry and intact.  Psychiatric: Her behavior is normal. Judgment and thought content normal.  Anxious  Nursing note and vitals reviewed.   ED Course  Procedures (including critical care time)  Medications - No data to display  Patient Vitals for the past 24 hrs:  BP Temp Temp src Pulse Resp SpO2  08/07/15 2100 116/74 mmHg - - 73 20 100 %  08/07/15 2045 115/64 mmHg - - 64 13 99 %  08/07/15 2000 118/65 mmHg - - 68 16 98 %  08/07/15 1930 141/94 mmHg - - 75 - 99 %  08/07/15 1916 153/95 mmHg - - 75 20 98 %  08/07/15 1808 (!) 127/108 mmHg 98.7 F (37.1 C) Oral 76 18 100 %   Poison control was contacted regarding the patient's complaint of toxic inhalation exposure. They recommended to avoid noxious gases, and treat patient symptomatically.  9:50 PM Reevaluation with update and discussion. After initial assessment and treatment, an updated evaluation reveals No additional complaints. Findings discussed with patient, all questions answered. Lanard Arguijo L    Labs Review Labs Reviewed  URINALYSIS, ROUTINE W REFLEX MICROSCOPIC (NOT AT [facility]) - Abnormal; Notable for the following:    APPearance HAZY (*)    Specific Gravity, Urine 1.035 (*)    Leukocytes, UA SMALL (*)    All other components within normal limits  COMPREHENSIVE METABOLIC PANEL - Abnormal; Notable for the following:    Potassium 3.4 (*)    CO2 21 (*)    All other components within normal limits  CBC WITH DIFFERENTIAL/PLATELET - Abnormal; Notable for the following:    Hemoglobin 11.3 (*)    HCT 35.7 (*)    All other components within normal limits  CARBOXYHEMOGLOBIN - Abnormal; Notable for the following:    Total hemoglobin 11.5 (*)    Carboxyhemoglobin 2.6 (*)    All other  components within normal limits  URINE MICROSCOPIC-ADD ON - Abnormal; Notable for the following:    Squamous Epithelial / LPF 6-30 (*)    Bacteria, UA FEW (*)    All other components within normal limits  I-STAT ARTERIAL BLOOD GAS, ED - Abnormal; Notable for the following:    pO2, Arterial 69.0 (*)    All other components within normal limits  URINE CULTURE    Imaging Review No results found. I have personally reviewed and evaluated these images and lab results as part of my medical decision-making.   EKG Interpretation   Date/Time:  Thursday Aug 07 2015 20:39:08 EDT Ventricular Rate:  67 PR Interval:  193 QRS Duration: 82 QT Interval:  454 QTC Calculation:  479 R Axis:   46 Text Interpretation:  Sinus rhythm Probable left atrial enlargement Low  voltage, precordial leads since last tracing no significant change  Confirmed by Lillard Bailon  MD, Symone Cornman [phone]) on 08/07/2015 9:44:29 PM      MDM   Final diagnoses:  Exposure to chemical inhalation    Nonsignificant exposure to suspected chemical. No evidence for carbon monoxide poisoning.  Nursing Notes Reviewed/ Care Coordinated Applicable Imaging Reviewed Interpretation of Laboratory Data incorporated into ED treatment  The patient appears reasonably screened and/or stabilized for discharge and I doubt any other medical condition or other [facility] requiring further screening, evaluation, or treatment in the ED at this time prior to discharge.  Plan: Home Medications- none; Home Treatments- rest; return here if the recommended treatment, does not improve the symptoms; Recommended follow up- PCP prn     Mancel Bale, MD 08/07/15 2339

## 2015-08-07 NOTE — ED Notes (Signed)
Spoke with Poison Control, recommended that we get EKG, she will also speak with a provider and call back.

## 2015-08-07 NOTE — ED Notes (Signed)
Called resp, en route to get ABG. Lab at bedside to collect blood

## 2015-08-07 NOTE — Discharge Instructions (Signed)
Get plenty of rest, drink a lot of fluids.  You can call the fire department, and have them look at your home, to see if there are toxic gases there.  For sinus congestion, take an antihistamine such as Benadryl and use Afrin nasal spray for a couple of days.

## 2015-08-07 NOTE — ED Notes (Signed)
Spoke with [employer] [employer] again. States that hydrogen sulfide usually has very toxic/fatal effects so if pt was removed from the source quickly she will make a full recovery. Poison control thinks that pt S/S are coming from elsewhere (i.e. anxiety)

## 2015-08-07 NOTE — ED Notes (Signed)
Pt sts inhaled hydrogen sulfide from septic pipes; pt sts hx of same; pt sts some nausea and shakiness and trouble catching breath

## 2015-08-09 LAB — URINE CULTURE: Special Requests: NORMAL

## 2015-09-10 ENCOUNTER — Other Ambulatory Visit: Payer: Self-pay | Admitting: Physician Assistant

## 2015-09-10 DIAGNOSIS — R198 Other specified symptoms and signs involving the digestive system and abdomen: Secondary | ICD-10-CM

## 2015-09-10 DIAGNOSIS — R1314 Dysphagia, pharyngoesophageal phase: Secondary | ICD-10-CM

## 2015-09-10 DIAGNOSIS — R0989 Other specified symptoms and signs involving the circulatory and respiratory systems: Secondary | ICD-10-CM

## 2015-09-12 ENCOUNTER — Ambulatory Visit
Admission: RE | Admit: 2015-09-12 | Discharge: 2015-09-12 | Disposition: A | Discharge status: Home / Self Care | Payer: Medicare HMO | Source: Ambulatory Visit | Attending: Physician Assistant | Admitting: Physician Assistant

## 2015-09-12 DIAGNOSIS — R1314 Dysphagia, pharyngoesophageal phase: Secondary | ICD-10-CM

## 2015-09-12 DIAGNOSIS — R0989 Other specified symptoms and signs involving the circulatory and respiratory systems: Secondary | ICD-10-CM

## 2015-09-12 DIAGNOSIS — R198 Other specified symptoms and signs involving the digestive system and abdomen: Secondary | ICD-10-CM

## 2016-08-21 IMAGING — DX DG CHEST 2V
2 series · 2 of 2 positions shown · non-contrast
Comparison: Chest radiograph performed 08/29/2009

CLINICAL DATA: Acute onset of centralized chest pain and weakness.
Initial encounter.

EXAM:
CHEST  2 VIEW

[chest pa]
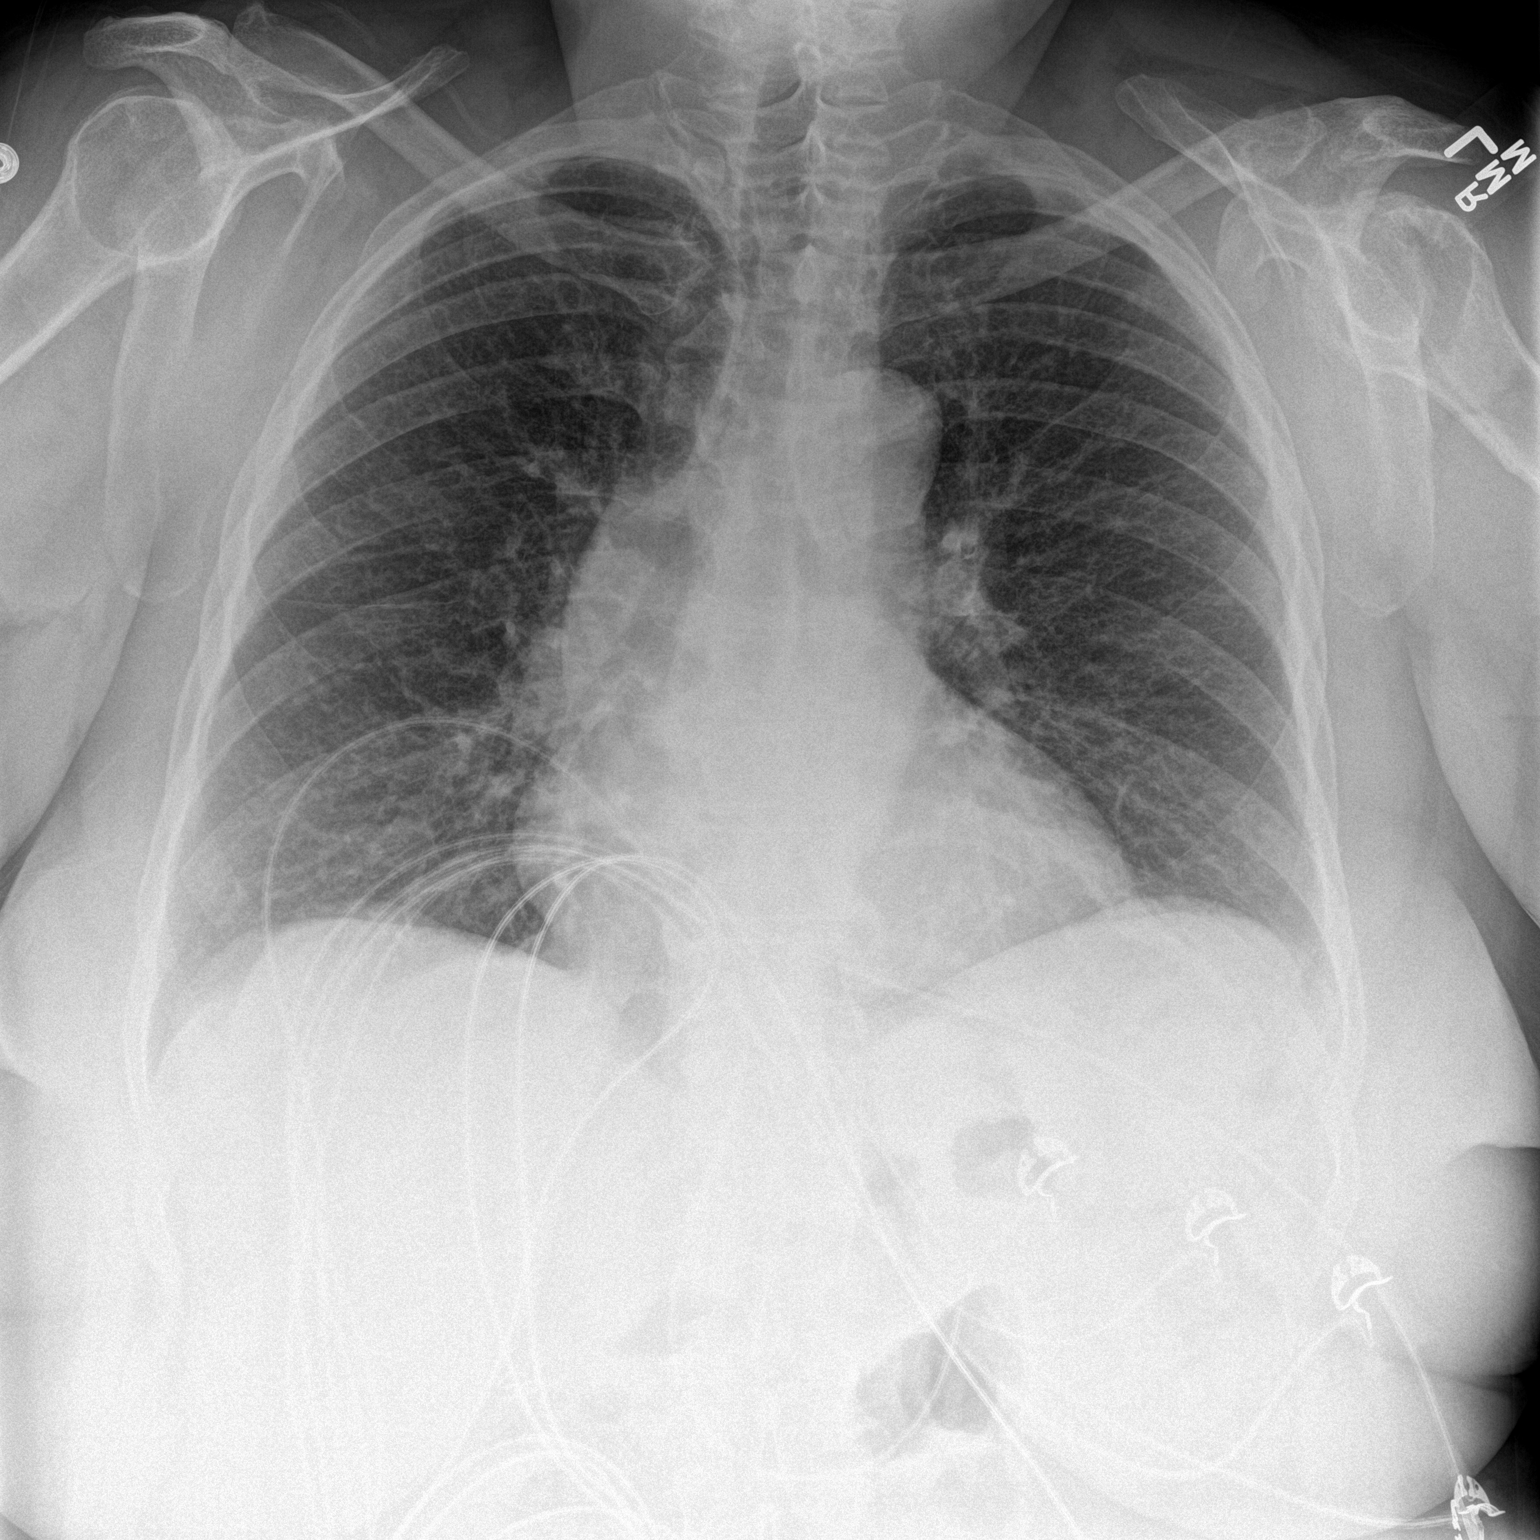

[chest lat]
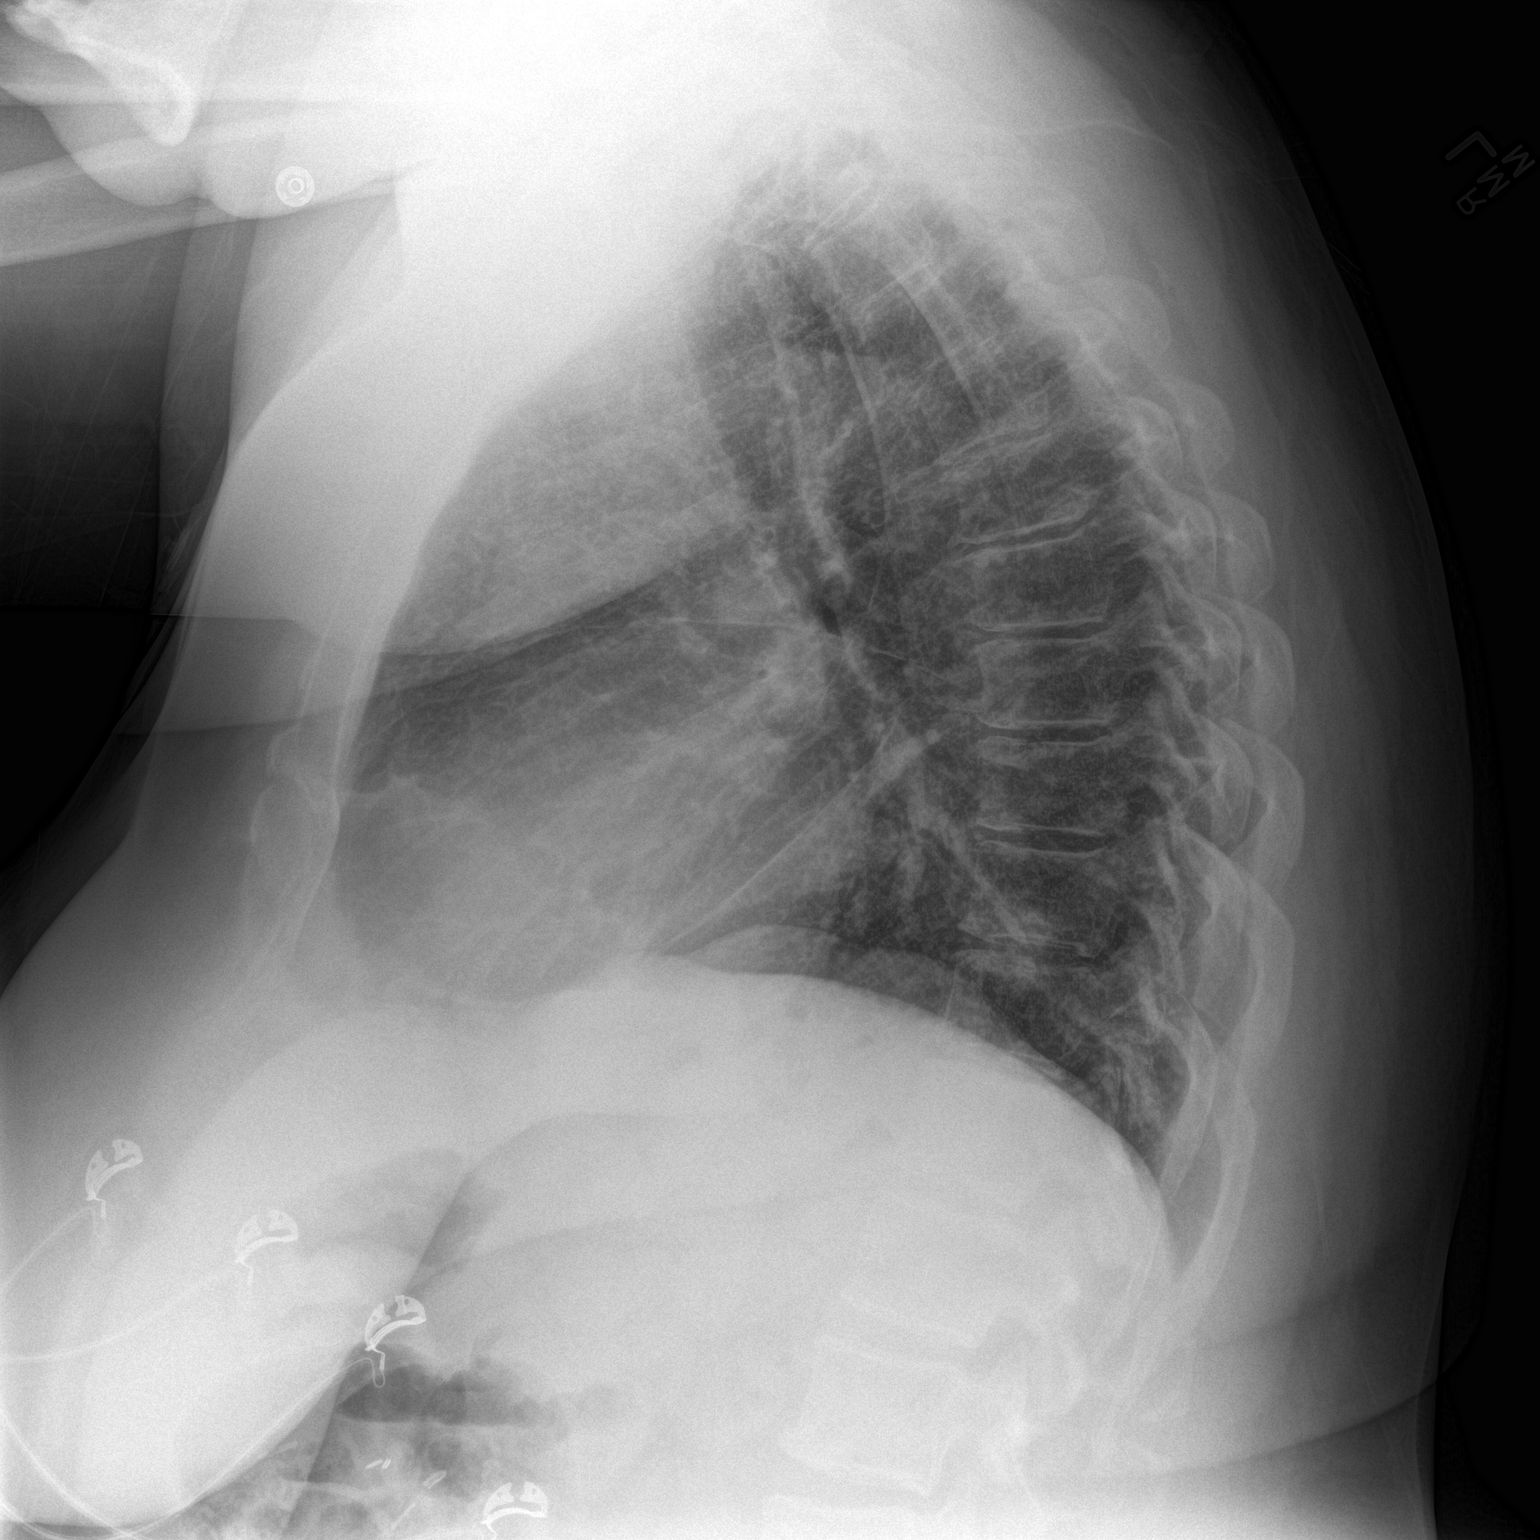

[2 of 2 positions shown; findings below may reference images not displayed]

FINDINGS: The lungs are well-aerated. Chronically increased interstitial
markings are seen. There is no evidence of focal opacification,
pleural effusion or pneumothorax.

The heart is borderline normal in size; the mediastinal contour is
within normal limits. No acute osseous abnormalities are seen. Clips
are noted within the right upper quadrant, reflecting prior
cholecystectomy.
IMPRESSION: Mild chronically increased interstitial markings noted. Lungs
otherwise clear.
